# Patient Record
Sex: Female | Born: 1995 | Race: White | Hispanic: No | Marital: Single | Smoking: Never smoker
Health system: Southern US, Community
[De-identification: ages and names within clinical notes are randomized; demographics above are authoritative.]

## PROBLEM LIST (undated history)

## (undated) DIAGNOSIS — J45909 Unspecified asthma, uncomplicated: Secondary | ICD-10-CM

## (undated) HISTORY — DX: Unspecified asthma, uncomplicated: J45.909

---
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
## 2017-06-17 ENCOUNTER — Encounter (HOSPITAL_COMMUNITY): Payer: Self-pay | Admitting: Emergency Medicine

## 2017-06-17 ENCOUNTER — Emergency Department (HOSPITAL_COMMUNITY): Payer: PRIVATE HEALTH INSURANCE

## 2017-06-17 ENCOUNTER — Other Ambulatory Visit: Payer: Self-pay

## 2017-06-17 ENCOUNTER — Emergency Department (HOSPITAL_COMMUNITY)
Admission: EM | Admit: 2017-06-17 | Discharge: 2017-06-17 | Disposition: A | Discharge status: Home / Self Care | Payer: PRIVATE HEALTH INSURANCE | Attending: Emergency Medicine | Admitting: Emergency Medicine

## 2017-06-17 DIAGNOSIS — S199XXA Unspecified injury of neck, initial encounter: Secondary | ICD-10-CM | POA: Diagnosis present

## 2017-06-17 DIAGNOSIS — Y9389 Activity, other specified: Secondary | ICD-10-CM | POA: Insufficient documentation

## 2017-06-17 DIAGNOSIS — Y999 Unspecified external cause status: Secondary | ICD-10-CM | POA: Diagnosis not present

## 2017-06-17 DIAGNOSIS — Y92410 Unspecified street and highway as the place of occurrence of the external cause: Secondary | ICD-10-CM | POA: Diagnosis not present

## 2017-06-17 DIAGNOSIS — J45909 Unspecified asthma, uncomplicated: Secondary | ICD-10-CM | POA: Diagnosis not present

## 2017-06-17 DIAGNOSIS — S161XXA Strain of muscle, fascia and tendon at neck level, initial encounter: Secondary | ICD-10-CM | POA: Insufficient documentation

## 2017-06-17 MED ORDER — CYCLOBENZAPRINE HCL 5 MG PO TABS: 10.0000 mg | ORAL_TABLET | Freq: Three times a day (TID) | ORAL | 0 refills | Status: AC | PRN

## 2017-06-17 MED ORDER — IBUPROFEN 600 MG PO TABS: 600.0000 mg | ORAL_TABLET | Freq: Four times a day (QID) | ORAL | 0 refills | Status: AC | PRN

## 2017-06-17 NOTE — Discharge Instructions (Signed)
Apply ice packs on and off to your neck and shoulder.  Follow-up with your primary doctor for recheck in 1 week if not improving.
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
## 2017-09-21 DIAGNOSIS — M25512 Pain in left shoulder: Secondary | ICD-10-CM | POA: Diagnosis not present

## 2017-09-21 DIAGNOSIS — M9902 Segmental and somatic dysfunction of thoracic region: Secondary | ICD-10-CM | POA: Diagnosis not present

## 2017-09-21 DIAGNOSIS — M9907 Segmental and somatic dysfunction of upper extremity: Secondary | ICD-10-CM | POA: Diagnosis not present

## 2017-09-26 DIAGNOSIS — M25512 Pain in left shoulder: Secondary | ICD-10-CM | POA: Diagnosis not present

## 2017-09-26 DIAGNOSIS — M9902 Segmental and somatic dysfunction of thoracic region: Secondary | ICD-10-CM | POA: Diagnosis not present

## 2017-09-26 DIAGNOSIS — M9907 Segmental and somatic dysfunction of upper extremity: Secondary | ICD-10-CM | POA: Diagnosis not present

## 2017-11-04 DIAGNOSIS — M7918 Myalgia, other site: Secondary | ICD-10-CM | POA: Diagnosis not present

## 2018-04-21 DIAGNOSIS — R5383 Other fatigue: Secondary | ICD-10-CM | POA: Diagnosis not present

## 2018-04-21 DIAGNOSIS — Z6822 Body mass index (BMI) 22.0-22.9, adult: Secondary | ICD-10-CM | POA: Diagnosis not present

## 2018-05-23 DIAGNOSIS — Z6822 Body mass index (BMI) 22.0-22.9, adult: Secondary | ICD-10-CM | POA: Diagnosis not present

## 2018-05-23 DIAGNOSIS — R5383 Other fatigue: Secondary | ICD-10-CM | POA: Diagnosis not present

## 2018-06-21 DIAGNOSIS — Z6822 Body mass index (BMI) 22.0-22.9, adult: Secondary | ICD-10-CM | POA: Diagnosis not present

## 2018-06-21 DIAGNOSIS — R5383 Other fatigue: Secondary | ICD-10-CM | POA: Diagnosis not present
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
